# Patient Record
Sex: Female | Born: 1960 | Hispanic: Yes | Marital: Married | Smoking: Never smoker
Health system: Southern US, Community
[De-identification: ages and names within clinical notes are randomized; demographics above are authoritative.]

## PROBLEM LIST (undated history)

## (undated) HISTORY — PX: CHOLECYSTECTOMY: SHX55

---

## 2016-04-18 ENCOUNTER — Encounter: Payer: Self-pay | Admitting: Emergency Medicine

## 2016-04-18 ENCOUNTER — Emergency Department: Payer: BLUE CROSS/BLUE SHIELD

## 2016-04-18 ENCOUNTER — Emergency Department
Admission: EM | Admit: 2016-04-18 | Discharge: 2016-04-18 | Disposition: A | Discharge status: Home / Self Care | Payer: BLUE CROSS/BLUE SHIELD | Attending: Emergency Medicine | Admitting: Emergency Medicine

## 2016-04-18 DIAGNOSIS — R0602 Shortness of breath: Secondary | ICD-10-CM | POA: Insufficient documentation

## 2016-04-18 DIAGNOSIS — R0789 Other chest pain: Secondary | ICD-10-CM | POA: Insufficient documentation

## 2016-04-18 DIAGNOSIS — R079 Chest pain, unspecified: Secondary | ICD-10-CM

## 2016-04-18 DIAGNOSIS — R221 Localized swelling, mass and lump, neck: Secondary | ICD-10-CM

## 2016-04-18 LAB — CBC
HCT: 39.4 % (ref 35.0–47.0)
HEMOGLOBIN: 13.3 g/dL (ref 12.0–16.0)
MCH: 28.9 pg (ref 26.0–34.0)
MCHC: 33.8 g/dL (ref 32.0–36.0)
MCV: 85.6 fL (ref 80.0–100.0)
PLATELETS: 308 10*3/uL (ref 150–440)
RBC: 4.6 MIL/uL (ref 3.80–5.20)
RDW: 13.5 % (ref 11.5–14.5)
WBC: 9.5 10*3/uL (ref 3.6–11.0)

## 2016-04-18 LAB — TSH: TSH: 0.955 u[IU]/mL (ref 0.350–4.500)

## 2016-04-18 LAB — BASIC METABOLIC PANEL
ANION GAP: 6 (ref 5–15)
BUN: 13 mg/dL (ref 6–20)
CO2: 31 mmol/L (ref 22–32)
CREATININE: 0.7 mg/dL (ref 0.44–1.00)
Calcium: 9.8 mg/dL (ref 8.9–10.3)
Chloride: 102 mmol/L (ref 101–111)
GFR calc Af Amer: >60 mL/min (ref >60)
GLUCOSE: 114 mg/dL — AB (ref 65–99)
Potassium: 3.6 mmol/L (ref 3.5–5.1)
Sodium: 139 mmol/L (ref 135–145)

## 2016-04-18 LAB — FIBRIN DERIVATIVES D-DIMER (ARMC ONLY): FIBRIN DERIVATIVES D-DIMER (ARMC): 291.34 (ref 0.00–499.00)

## 2016-04-18 LAB — TROPONIN I: Troponin I: <0.03 ng/mL (ref <0.03)

## 2016-04-18 MED ORDER — ASPIRIN 81 MG PO CHEW
324.0000 mg | CHEWABLE_TABLET | Freq: Once | ORAL | Status: AC
  Administered 2016-04-18: 324 mg via ORAL
  Filled 2016-04-18: qty 4

## 2016-04-18 NOTE — ED Triage Notes (Signed)
Pt presents to ED with c/o intermittent midline chest pain when she is angry. Pt states pain radiates to R side of her neck. Pt also c/o "something jumping in [her] neck". Pt states when she was at dinner she was told by friends noticed that she had "a jumping in her neck". Upon palpation, pt appears to be speaking about her carotid pulse. When patient is sitting with her head in upright position facing forward, pt is noted to have a visible pulsing to either side of her neck. Isaias, Interpreter present for triage.

## 2016-04-18 NOTE — Discharge Instructions (Signed)
You have been seen in the Emergency Department (ED) today for chest pain.  As we have discussed today?s test results are normal, but you may require further testing.  Please follow up with the recommended doctor as instructed above in these documents regarding today?s emergent visit and your recent symptoms to discuss further management.  Continue to take your regular medications. If you are not doing so already, please also take a daily baby aspirin (81 mg), at least until you follow up with your doctor.  Return to the Emergency Department (ED) if you experience any further chest pain/pressure/tightness, difficulty breathing, or sudden sweating, or other symptoms that concern you.   Also, it is suspicious that she was thyroid gland may be enlarged. Your thyroid test today was normal, I recommend follow-up with your regular doctor or Dr. Renae Fickle of endocrinology at your earliest convenience for follow-up and evaluation, possibly an ultrasound of your neck to evaluate your thyroid gland in more detail.

## 2016-04-18 NOTE — ED Provider Notes (Signed)
[facility] Emergency Department Provider Note ____________________________________________   First MD Initiated Contact with Patient 04/18/16 1557     (approximate)  I have reviewed the triage vital signs and the nursing notes.  HISTORY  Chief Complaint Chest Pain and Shortness of Breath  Spanish interpreter, [place] utilized  HPI Leah Miles is a 56 y.o. female reports no significant medical history other than her gallbladder removed several years ago  She comes in today, she reports that on Thursday while at work she was under stress and began experiencing discomfort in the left side of her chest that went away after taking Aleve tablet. It lasts about an hour. She reports she gets this same pain off and on for years whenever she is under "stress". No nausea or vomiting. No radiating pain. She also reports that she's noticed that when she gets stressed the front of her throat seems to "pulsating" their families knows the same and reports been present for many years.  No chest pain now. Last discomfort was on Thursday. Denies any history of heart problems. She does not smoke.  She has never had a stress test. She did not come in until today because she had a work on Thursday, and her family worked the last couple days, today was the first day that she could come for evaluation immediately   History reviewed. No pertinent past medical history.  There are no active problems to display for this patient.   Past Surgical History:  Procedure Laterality Date  . CHOLECYSTECTOMY      Prior to Admission medications   Not on File  Denies any allergies  Allergies Patient has no known allergies.  No family history on file.  Social History Social History  Substance Use Topics  . Smoking status: Never Smoker  . Smokeless tobacco: Never Used  . Alcohol use No    Review of Systems Constitutional: No fever/chills Eyes: No visual changes. ENT: No  sore throat. Cardiovascular: Denies chest pain Now but reports a "discomfort that occurred on Thursday. Respiratory: Denies shortness of breath. However, sometimes during these episodes she does feel slightly breathless, but reports no trouble breathing or cough or any issue for the last 2 days. She did feel a little short of breath during the episode on Thursday. Gastrointestinal: No abdominal pain.  No nausea, no vomiting.  No diarrhea.  No constipation. Genitourinary: Negative for dysuria. Musculoskeletal: Negative for back pain. Skin: Negative for rash. Neurological: Negative for headaches, focal weakness or numbness.  10-point ROS otherwise negative.  ____________________________________________   PHYSICAL EXAM:  VITAL SIGNS: ED Triage Vitals  Enc Vitals Group     BP 04/18/16 1405 (!) 168/87     Pulse Rate 04/18/16 1405 85     Resp 04/18/16 1405 18     Temp 04/18/16 1405 98.7 F (37.1 C)     Temp Source 04/18/16 1405 Oral     SpO2 04/18/16 1405 97 %     Weight 04/18/16 1406 142 lb (64.4 kg)     Height 04/18/16 1406 5' (1.524 m)     Head Circumference --      Peak Flow --      Pain Score --      Pain Loc --      Pain Edu? --      Excl. in GC? --     Constitutional: Alert and oriented. Well appearing and in no acute distress.Very pleasant. See comfortably in the chair. Denying any  discomfort or symptoms now. Eyes: Conjunctivae are normal. PERRL. EOMI. Head: Atraumatic. Nose: No congestion/rhinnorhea. Mouth/Throat: Mucous membranes are moist.  Oropharynx non-erythematous. Neck: No stridor.  Question if she may have a slightly prominent thyroid. There is no tenderness. Cardiovascular: Normal rate, regular rhythm. Grossly normal heart sounds.  Good peripheral circulation. Respiratory: Normal respiratory effort.  No retractions. Lungs CTAB. Gastrointestinal: Soft and nontender. No distention.  Musculoskeletal: No lower extremity tenderness nor edema.   Neurologic:  Normal  speech and language. No gross focal neurologic deficits are appreciated. No gait instability. Skin:  Skin is warm, dry and intact. No rash noted. Psychiatric: Mood and affect are normal. Speech and behavior are normal.  ____________________________________________   LABS (all labs ordered are listed, but only abnormal results are displayed)  Labs Reviewed  BASIC METABOLIC PANEL - Abnormal; Notable for the following:       Result Value   Glucose, Bld 114 (*)    All other components within normal limits  CBC  TROPONIN I  FIBRIN DERIVATIVES D-DIMER ([facility] ONLY)  TROPONIN I  TSH   ____________________________________________  EKG  ED ECG REPORT I, Loella Hickle, the attending physician, personally viewed and interpreted this ECG.  Date: 04/18/2016 EKG Time: 1400 Rate: 90 Rhythm: normal sinus rhythm QRS Axis: normal Intervals: normal ST/T Wave abnormalities: normal Conduction Disturbances: none Narrative Interpretation: unremarkable  ____________________________________________  RADIOLOGY  Dg Chest 2 View  Result Date: 04/18/2016 CLINICAL DATA:  Chest pain for 2 weeks. EXAM: CHEST  2 VIEW COMPARISON:  None. FINDINGS: The heart size and mediastinal contours are within normal limits. Both lungs are clear. The visualized skeletal structures are unremarkable. IMPRESSION: No active cardiopulmonary disease. No evidence of pneumonia or pulmonary edema. Electronically Signed   By: Bary Richard M.D.   On: 04/18/2016 14:56    ____________________________________________   PROCEDURES  Procedure(s) performed: None  Procedures  Critical Care performed: No  ____________________________________________   INITIAL IMPRESSION / ASSESSMENT AND PLAN / ED COURSE  Pertinent labs & imaging results that were available during my care of the patient were reviewed by me and considered in my medical decision making (see chart for details).  Patient presents for evaluation of some  atypical chest discomfort which occurred on Thursday. Also some slight fullness over the thyroid region of the neck without tenderness or discrete mass palpable. No trouble breathing or stridor. No overlying skin changes.  Chest pain. Now resolved. Sounds possibly related to anxiety or stress, and seems to be recurring issue for the patient. Thus far her workup here today is reassuring. No significant risk factors for pulmonary embolism, dissection, pneumothorax. Lax infectious symptoms. No abdominal pain    Patient heart score is low risk. Her troponin is normal as well as her EKG, given her chest pain symptoms were a few days ago and she has not had a recurrence and it very unlikely this represents an acute coronary syndrome. She last significant risk factors other than age. No addition her thyroid seems just slightly prominent in the anterior neck but nontender. I discussed with the patient and recommended they follow-up with cardiology this week with careful chest pain return precautions, and also discussed following up with her primary care doctor for further evaluation of her thyroid including a possible thyroid ultrasound. She and her son are both very agreeable with this plan.  Low risk by well's, negative d-dimer.  ----------------------------------------- 8:06 PM on 04/18/2016 -----------------------------------------  Second troponin normal, no further discomfort or concerns while in the ER.  No chest pain. Discussed with the patient and provided careful discharge plan to follow-up with cardiology as well as endocrinology for follow-up on today's issues. Patient and her son agreeable with plan. Discharge completed via Spanish interpreter. ____________________________________________   FINAL CLINICAL IMPRESSION(S) / ED DIAGNOSES  Final diagnoses:  Chest pain with low risk for cardiac etiology  Neck fullness      NEW MEDICATIONS STARTED DURING THIS VISIT:  New Prescriptions   No  medications on file     Note:  This document was prepared using Dragon voice recognition software and may include unintentional dictation errors.     Sharyn Creamer, MD 04/18/16 2007

## 2016-04-18 NOTE — ED Notes (Signed)

## 2020-04-22 ENCOUNTER — Other Ambulatory Visit: Payer: Self-pay

## 2020-04-22 ENCOUNTER — Emergency Department: Payer: BC Managed Care – PPO

## 2020-04-22 ENCOUNTER — Emergency Department
Admission: EM | Admit: 2020-04-22 | Discharge: 2020-04-22 | Disposition: A | Discharge status: Home / Self Care | Payer: BC Managed Care – PPO | Attending: Emergency Medicine | Admitting: Emergency Medicine

## 2020-04-22 DIAGNOSIS — R109 Unspecified abdominal pain: Secondary | ICD-10-CM

## 2020-04-22 DIAGNOSIS — R030 Elevated blood-pressure reading, without diagnosis of hypertension: Secondary | ICD-10-CM | POA: Diagnosis not present

## 2020-04-22 DIAGNOSIS — N1330 Unspecified hydronephrosis: Secondary | ICD-10-CM | POA: Insufficient documentation

## 2020-04-22 DIAGNOSIS — R1012 Left upper quadrant pain: Secondary | ICD-10-CM | POA: Diagnosis present

## 2020-04-22 LAB — CBC
HCT: 40.5 % (ref 36.0–46.0)
Hemoglobin: 13.7 g/dL (ref 12.0–15.0)
MCH: 28.4 pg (ref 26.0–34.0)
MCHC: 33.8 g/dL (ref 30.0–36.0)
MCV: 84 fL (ref 80.0–100.0)
Platelets: 290 10*3/uL (ref 150–400)
RBC: 4.82 MIL/uL (ref 3.87–5.11)
RDW: 14 % (ref 11.5–15.5)
WBC: 11.9 10*3/uL — ABNORMAL HIGH (ref 4.0–10.5)
nRBC: 0 % (ref 0.0–0.2)

## 2020-04-22 LAB — HEPATIC FUNCTION PANEL
ALT: 22 U/L (ref 0–44)
AST: 34 U/L (ref 15–41)
Albumin: 4.7 g/dL (ref 3.5–5.0)
Alkaline Phosphatase: 97 U/L (ref 38–126)
Bilirubin, Direct: <0.1 mg/dL (ref 0.0–0.2)
Total Bilirubin: 0.6 mg/dL (ref 0.3–1.2)
Total Protein: 7.5 g/dL (ref 6.5–8.1)

## 2020-04-22 LAB — BASIC METABOLIC PANEL
Anion gap: 12 (ref 5–15)
BUN: 12 mg/dL (ref 6–20)
CO2: 26 mmol/L (ref 22–32)
Calcium: 9.4 mg/dL (ref 8.9–10.3)
Chloride: 100 mmol/L (ref 98–111)
Creatinine, Ser: 0.85 mg/dL (ref 0.44–1.00)
GFR, Estimated: >60 mL/min (ref >60)
Glucose, Bld: 133 mg/dL — ABNORMAL HIGH (ref 70–99)
Potassium: 3.5 mmol/L (ref 3.5–5.1)
Sodium: 138 mmol/L (ref 135–145)

## 2020-04-22 LAB — URINALYSIS, COMPLETE (UACMP) WITH MICROSCOPIC
Bacteria, UA: NONE SEEN
Bilirubin Urine: NEGATIVE
Glucose, UA: NEGATIVE mg/dL
Ketones, ur: NEGATIVE mg/dL
Leukocytes,Ua: NEGATIVE
Nitrite: NEGATIVE
Protein, ur: NEGATIVE mg/dL
RBC / HPF: >50 RBC/hpf — ABNORMAL HIGH (ref 0–5)
Specific Gravity, Urine: 1.01 (ref 1.005–1.030)
pH: 6 (ref 5.0–8.0)

## 2020-04-22 LAB — LIPASE, BLOOD: Lipase: 33 U/L (ref 11–51)

## 2020-04-22 MED ORDER — ONDANSETRON HCL 4 MG/2ML IJ SOLN
4.0000 mg | Freq: Once | INTRAMUSCULAR | Status: AC
  Administered 2020-04-22: 4 mg via INTRAVENOUS
  Filled 2020-04-22: qty 2

## 2020-04-22 MED ORDER — IOHEXOL 300 MG/ML  SOLN
100.0000 mL | Freq: Once | INTRAMUSCULAR | Status: AC | PRN
  Administered 2020-04-22: 100 mL via INTRAVENOUS
  Filled 2020-04-22: qty 100

## 2020-04-22 MED ORDER — MORPHINE SULFATE (PF) 4 MG/ML IV SOLN
4.0000 mg | Freq: Once | INTRAVENOUS | Status: AC
  Administered 2020-04-22: 4 mg via INTRAVENOUS
  Filled 2020-04-22: qty 1

## 2020-04-22 MED ORDER — OXYCODONE-ACETAMINOPHEN 5-325 MG PO TABS: 1.0000 | ORAL_TABLET | ORAL | 0 refills | Status: DC | PRN

## 2020-04-22 MED ORDER — ONDANSETRON 4 MG PO TBDP: 4.0000 mg | ORAL_TABLET | Freq: Three times a day (TID) | ORAL | 0 refills | Status: DC | PRN

## 2020-04-22 NOTE — ED Provider Notes (Signed)
[facility] Emergency Department Provider Note   ____________________________________________   Event Date/Time   First MD Initiated Contact with Patient 04/22/20 1414     (approximate)  I have reviewed the triage vital signs and the nursing notes.   HISTORY  Chief Complaint Flank Pain and Vomiting    HPI Leah Miles is a 60 y.o. female with no significant past medical history who presents to the ED complaining of flank pain.  Patient reports that she developed severe pain in her left flank this morning around 5:30 AM.  Pain has started to radiate around to the left upper quadrant of her abdomen, is described as sharp and constant.  Pain is not exacerbated or alleviated by anything, but is associated with multiple episodes of vomiting.  She denies any diarrhea, fever, dysuria, or hematuria.  She reports dealing with similar symptoms multiple years ago with no clear diagnosis at that time.  She denies any history of kidney stones.  She is postmenopausal.        History reviewed. No pertinent past medical history.  There are no problems to display for this patient.   Past Surgical History:  Procedure Laterality Date  . CHOLECYSTECTOMY      Prior to Admission medications   Medication Sig Start Date End Date Taking? Authorizing Provider  ondansetron (ZOFRAN ODT) 4 MG disintegrating tablet Take 1 tablet (4 mg total) by mouth every 8 (eight) hours as needed for nausea or vomiting. 04/22/20  Yes Chesley Noon, MD  oxyCODONE-acetaminophen (PERCOCET) 5-325 MG tablet Take 1 tablet by mouth every 4 (four) hours as needed for severe pain. 04/22/20 04/22/21 Yes Chesley Noon, MD    Allergies Patient has no known allergies.  History reviewed. No pertinent family history.  Social History Social History   Tobacco Use  . Smoking status: Never Smoker  . Smokeless tobacco: Never Used  Substance Use Topics  . Alcohol use: No  . Drug use: No     Review of Systems  Constitutional: No fever/chills Eyes: No visual changes. ENT: No sore throat. Cardiovascular: Denies chest pain. Respiratory: Denies shortness of breath. Gastrointestinal: Positive for flank and abdominal pain.  Positive for nausea and vomiting.  No diarrhea.  No constipation. Genitourinary: Negative for dysuria. Musculoskeletal: Negative for back pain. Skin: Negative for rash. Neurological: Negative for headaches, focal weakness or numbness.  ____________________________________________   PHYSICAL EXAM:  VITAL SIGNS: ED Triage Vitals  Enc Vitals Group     BP 04/22/20 1409 (!) 203/113     Pulse Rate 04/22/20 1409 (!) 107     Resp 04/22/20 1409 16     Temp 04/22/20 1409 98.4 F (36.9 C)     Temp Source 04/22/20 1409 Oral     SpO2 04/22/20 1409 99 %     Weight 04/22/20 1410 160 lb (72.6 kg)     Height 04/22/20 1410 5\' 2"  (1.575 m)     Head Circumference --      Peak Flow --      Pain Score 04/22/20 1409 8     Pain Loc --      Pain Edu? --      Excl. in GC? --     Constitutional: Alert and oriented. Eyes: Conjunctivae are normal. Head: Atraumatic. Nose: No congestion/rhinnorhea. Mouth/Throat: Mucous membranes are moist. Neck: Normal ROM Cardiovascular: Normal rate, regular rhythm. Grossly normal heart sounds. Respiratory: Normal respiratory effort.  No retractions. Lungs CTAB. Gastrointestinal: Soft and tender to palpation in the  left upper quadrant with no rebound or guarding.  No CVA tenderness noted. No distention. Genitourinary: deferred Musculoskeletal: No lower extremity tenderness nor edema. Neurologic:  Normal speech and language. No gross focal neurologic deficits are appreciated. Skin:  Skin is warm, dry and intact. No rash noted. Psychiatric: Mood and affect are normal. Speech and behavior are normal.  ____________________________________________   LABS (all labs ordered are listed, but only abnormal results are  displayed)  Labs Reviewed  URINALYSIS, COMPLETE (UACMP) WITH MICROSCOPIC - Abnormal; Notable for the following components:      Result Value   Color, Urine YELLOW (*)    APPearance HAZY (*)    Hgb urine dipstick LARGE (*)    RBC / HPF >50 (*)    All other components within normal limits  BASIC METABOLIC PANEL - Abnormal; Notable for the following components:   Glucose, Bld 133 (*)    All other components within normal limits  CBC - Abnormal; Notable for the following components:   WBC 11.9 (*)    All other components within normal limits  HEPATIC FUNCTION PANEL  LIPASE, BLOOD   ____________________________________________  EKG  ED ECG REPORT I, Chesley Noon, the attending physician, personally viewed and interpreted this ECG.   Date: 04/22/2020  EKG Time: 14:54  Rate: 87  Rhythm: normal sinus rhythm  Axis: Normal  Intervals:none  ST&T Change: None   PROCEDURES  Procedure(s) performed (including Critical Care):  Procedures   ____________________________________________   INITIAL IMPRESSION / ASSESSMENT AND PLAN / ED COURSE       60 year old female with no significant past medical history presents to the ED complaining of acute onset left flank pain radiating around to the left upper quadrant of her abdomen starting at 530 this morning.  Patient has some tenderness in her left upper quadrant but no CVA tenderness and denies any urinary symptoms.  UA is pending to further assess for pyelonephritis, we will also check CT scan to rule out nephrolithiasis, renal ischemia, or perforated gastric ulcer.  Patient noted to be markedly hypertensive, denies history of hypertension.  Low suspicion for acute aortic emergency given description of her symptoms, EKG is unremarkable and I doubt other hypertensive emergency.  We will treat pain and recheck blood pressure.  Blood pressure significantly improved following pain medication.  UA shows hematuria but no signs of infection,  lab work also reassuring.  CT scan shows ureterectasis and hydronephrosis on the left but with no obvious source of obstruction.  It is possible patient passed kidney stone or has stone not visible due to IV contrast.  Imaging discussed with Dr. Lonna Cobb of urology, who agrees that patient is appropriate for outpatient follow-up with symptomatic treatment for now.  She has a mildly enlarged bladder but has had no difficulty urinating since the time of her CT scan.  She was counseled to return to the ED for new worsening symptoms, patient agrees with plan.      ____________________________________________   FINAL CLINICAL IMPRESSION(S) / ED DIAGNOSES  Final diagnoses:  Flank pain  Hydronephrosis, unspecified hydronephrosis type     ED Discharge Orders         Ordered    oxyCODONE-acetaminophen (PERCOCET) 5-325 MG tablet  Every 4 hours PRN        04/22/20 1627    ondansetron (ZOFRAN ODT) 4 MG disintegrating tablet  Every 8 hours PRN        04/22/20 1627  Note:  This document was prepared using Dragon voice recognition software and may include unintentional dictation errors.   Chesley Noon, MD 04/22/20 [phone]

## 2020-04-22 NOTE — ED Notes (Signed)
Pain med given.  Says not much better yet.  She does appear more relaxed and is able to lie down on the bed.

## 2020-04-22 NOTE — ED Triage Notes (Signed)
C/o L sided flank pain with vomiting that began today. Took 1 ibuprofen and aleeve. A&O, ambulatory. Spanish interpreter used for triage.

## 2020-04-24 ENCOUNTER — Ambulatory Visit: Payer: BC Managed Care – PPO | Admitting: Urology

## 2020-04-24 ENCOUNTER — Other Ambulatory Visit: Payer: Self-pay

## 2020-04-24 ENCOUNTER — Encounter: Payer: Self-pay | Admitting: Urology

## 2020-04-24 VITALS — BP 179/102 | HR 92 | Ht 60.0 in | Wt 168.0 lb

## 2020-04-24 DIAGNOSIS — R3129 Other microscopic hematuria: Secondary | ICD-10-CM

## 2020-04-24 DIAGNOSIS — N289 Disorder of kidney and ureter, unspecified: Secondary | ICD-10-CM

## 2020-04-24 DIAGNOSIS — N1330 Unspecified hydronephrosis: Secondary | ICD-10-CM

## 2020-04-25 ENCOUNTER — Encounter: Payer: Self-pay | Admitting: Urology

## 2020-04-25 NOTE — Progress Notes (Signed)
   04/24/2020 11:54 AM   [name] Nov 25, 1960 [id number]  Referring provider: No referring provider defined for this encounter.  Chief Complaint  Patient presents with  . Other    HPI: Leah Miles is a 60 y.o. female who presents in follow-up of a recent [facility] ED visit.  She is accompanied today by her husband and a [employer] Spanish interpreter.   Presented to ED 04/22/2020 with a 1 day history of left flank pain which radiated to the left upper quadrant  Pain rated severe without precipitating, aggravating or alleviating factors  + Nausea/vomiting  - Fever, chills  No dysuria or gross hematuria  No prior urologic history or similar symptoms  CT abdomen/pelvis with contrast was performed which showed mild left ureterectasis and perinephric stranding  Urinalysis showed >50 RBCs  She received parenteral analgesics with resolution of her pain and states her pain never recurred and she is presently asymptomatic.   PMH: History reviewed. No pertinent past medical history.  Surgical History: Past Surgical History:  Procedure Laterality Date  . CHOLECYSTECTOMY      Home Medications:  Allergies as of 04/24/2020   No Known Allergies     Medication List       Accurate as of April 24, 2020 11:59 PM. If you have any questions, ask your nurse or doctor.        STOP taking these medications   ondansetron 4 MG disintegrating tablet Commonly known as: Zofran ODT Stopped by: Riki Altes, MD   oxyCODONE-acetaminophen 5-325 MG tablet Commonly known as: Percocet Stopped by: Riki Altes, MD       Allergies: No Known Allergies  Family History: History reviewed. No pertinent family history.  Social History:  reports that she has never smoked. She has never used smokeless tobacco. She reports that she does not drink alcohol and does not use drugs.   Physical Exam: BP (!) 179/102   Pulse 92   Ht 5' (1.524 m)   Wt 168 lb (76.2 kg)   BMI 32.81  kg/m   Constitutional:  Alert, No acute distress. HEENT: [place] AT, moist mucus membranes.  Trachea midline, no masses. Cardiovascular: No clubbing, cyanosis, or edema. Respiratory: Normal respiratory effort, no increased work of breathing.   Laboratory Data:  Urinalysis Dipstick/microscopy negative  Pertinent Imaging: CT images were personally reviewed and interpreted  Assessment & Plan:    60 y.o. female with a history of left flank pain and microhematuria.  CT showed mild left hydronephrosis/hydroureter.  She most likely had a small stone which she has passed.  Pain and microhematuria have resolved.  Recommend a follow-up renal ultrasound in ~ 1 month to document resolution of hydronephrosis.  Repeat UA at that time.   Riki Altes, MD  [facility] [address] [place], [place] [address] [phone]

## 2020-04-28 LAB — MICROSCOPIC EXAMINATION: RBC: NONE SEEN /hpf (ref 0–2)

## 2020-04-28 LAB — URINALYSIS, COMPLETE
Bilirubin, UA: NEGATIVE
Glucose, UA: NEGATIVE
Ketones, UA: NEGATIVE
Leukocytes,UA: NEGATIVE
Nitrite, UA: NEGATIVE
Protein,UA: NEGATIVE
RBC, UA: NEGATIVE
Specific Gravity, UA: 1.015 (ref 1.005–1.030)
Urobilinogen, Ur: 0.2 mg/dL (ref 0.2–1.0)
pH, UA: 6 (ref 5.0–7.5)

## 2020-05-23 ENCOUNTER — Ambulatory Visit
Admission: RE | Admit: 2020-05-23 | Discharge: 2020-05-23 | Disposition: A | Discharge status: Home / Self Care | Payer: BC Managed Care – PPO | Source: Ambulatory Visit | Attending: Urology | Admitting: Urology

## 2020-05-23 ENCOUNTER — Other Ambulatory Visit: Payer: Self-pay

## 2020-05-23 DIAGNOSIS — N1330 Unspecified hydronephrosis: Secondary | ICD-10-CM | POA: Diagnosis present

## 2020-05-29 ENCOUNTER — Encounter: Payer: Self-pay | Admitting: Urology

## 2020-05-29 ENCOUNTER — Other Ambulatory Visit: Payer: Self-pay

## 2020-05-29 ENCOUNTER — Ambulatory Visit: Payer: BC Managed Care – PPO | Admitting: Urology

## 2020-05-29 DIAGNOSIS — Z87448 Personal history of other diseases of urinary system: Secondary | ICD-10-CM

## 2020-05-29 NOTE — Progress Notes (Signed)
   05/29/2020 1:41 PM   Leah Miles [birth year] [id number]  Referring provider: No referring provider defined for this encounter.  Chief Complaint  Patient presents with  . Other    HPI: 60 y.o. female presents for follow-up.  A Spanish interpreter was present via video link.   Refer to my previous note 04/24/2020  She remains asymptomatic without recurrent pain  Follow-up renal ultrasound 05/23/2020 showed resolution of her hydronephrosis  She states she does note back pain after eating mangoes or bananas   PMH: History reviewed. No pertinent past medical history.  Surgical History: Past Surgical History:  Procedure Laterality Date  . CHOLECYSTECTOMY      Home Medications:  Allergies as of 05/29/2020   No Known Allergies     Medication List       Accurate as of May 29, 2020  1:41 PM. If you have any questions, ask your nurse or doctor.        metaxalone 800 MG tablet Commonly known as: SKELAXIN Take 1 tablet by mouth 3 (three) times daily.       Allergies: No Known Allergies  Family History: History reviewed. No pertinent family history.  Social History:  reports that she has never smoked. She has never used smokeless tobacco. She reports that she does not drink alcohol and does not use drugs.   Physical Exam: There were no vitals taken for this visit.  Constitutional:  Alert and oriented, No acute distress. HEENT: [place] AT, moist mucus membranes.  Trachea midline, no masses. Cardiovascular: No clubbing, cyanosis, or edema. Respiratory: Normal respiratory effort, no increased work of breathing. GI: Abdomen is soft, nontender, nondistended, no abdominal masses GU: No CVA tenderness Lymph: No cervical or inguinal lymphadenopathy. Skin: No rashes, bruises or suspicious lesions. Neurologic: Grossly intact, no focal deficits, moving all 4 extremities. Psychiatric: Normal mood and affect.  Laboratory Data:  Urinalysis Dipstick/microscopy  negative  Pertinent Imaging: Images were personally reviewed  Ultrasound renal complete  Narrative CLINICAL DATA:  Follow-up hydronephrosis of left kidney.  EXAM: RENAL / URINARY TRACT ULTRASOUND COMPLETE  COMPARISON:  CT abdomen pelvis 04/22/2020  FINDINGS: Right Kidney:  Renal measurements: 10.9 x 3.8 x 4.4 cm = volume: 96 mL. Echogenicity within normal limits. No mass or hydronephrosis visualized.  Left Kidney:  Renal measurements: 10.1 x 5.1 x 4.9 cm = volume: 132 mL. Echogenicity within normal limits. No mass or hydronephrosis visualized.  Bladder:  Appears normal for degree of bladder distention.  Other:  Incidentally noted the liver echogenicity appears diffusely increased as can be seen with hepatic steatosis.  IMPRESSION: 1. No bilateral hydronephrosis.  2. Diffusely increased liver parenchymal echogenicity which is nonspecific but most commonly seen with hepatic steatosis.   Electronically Signed By: Emmaline Kluver M.D. On: 05/24/2020 13:20  No results found for this or any previous visit.  No results found for this or any previous visit.  No results found for this or any previous visit.   Assessment & Plan:    1. Hydronephrosis of left kidney  Resolved  Symptoms most likely secondary to a small stone which passed  Recommend 1 year follow-up with [facility]  2.  History microhematuria  Last 2 UAs have been negative and feel microhematuria in ED was secondary to stone   Riki Altes, MD  [facility] [address] [place], [place] [address] [phone]

## 2020-05-30 LAB — URINALYSIS, COMPLETE
Bilirubin, UA: NEGATIVE
Glucose, UA: NEGATIVE
Ketones, UA: NEGATIVE
Leukocytes,UA: NEGATIVE
Nitrite, UA: NEGATIVE
Protein,UA: NEGATIVE
RBC, UA: NEGATIVE
Specific Gravity, UA: <1.005 — ABNORMAL LOW (ref 1.005–1.030)
Urobilinogen, Ur: 0.2 mg/dL (ref 0.2–1.0)
pH, UA: 6 (ref 5.0–7.5)

## 2020-05-30 LAB — MICROSCOPIC EXAMINATION
Bacteria, UA: NONE SEEN
RBC, Urine: NONE SEEN /hpf (ref 0–2)
WBC, UA: NONE SEEN /hpf (ref 0–5)

## 2021-06-01 ENCOUNTER — Other Ambulatory Visit: Payer: Self-pay | Admitting: *Deleted

## 2021-06-01 DIAGNOSIS — N1330 Unspecified hydronephrosis: Secondary | ICD-10-CM

## 2021-06-04 ENCOUNTER — Ambulatory Visit: Payer: BC Managed Care – PPO | Admitting: Urology

## 2021-06-07 IMAGING — CT CT ABD-PELV W/ CM
2 of 5 series · 14 of 46 positions shown, 16 images · IV contrast (APPLIED)
Comparison: None.

CLINICAL DATA: Left upper quadrant abdominal pain and vomiting.

EXAM:
CT ABDOMEN AND PELVIS WITH CONTRAST
TECHNIQUE: Multidetector CT imaging of the abdomen and pelvis was performed
using the standard protocol following bolus administration of
intravenous contrast.
CONTRAST:  100mL OMNIPAQUE IOHEXOL 300 MG/ML  SOLN

[Series 2: axial st · axial · 0.73mm/px · z∈[-496,-71]mm · 11 of 97 slices shown, 13 images]
[im 6/97  soft-tissue]
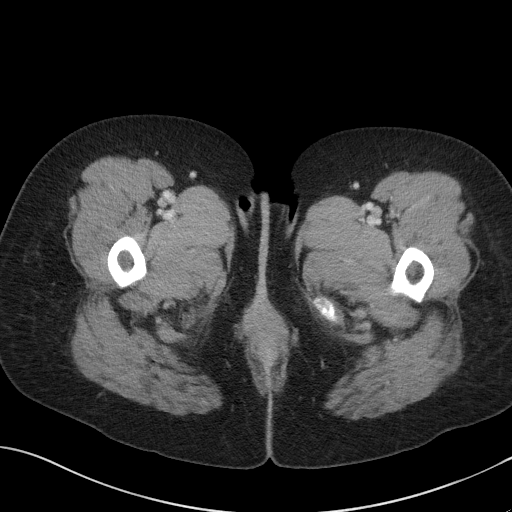
[im 6/97  bone]
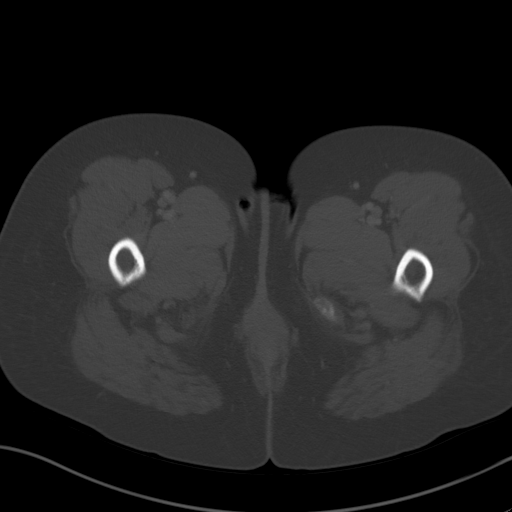
[im 17/97  soft-tissue]
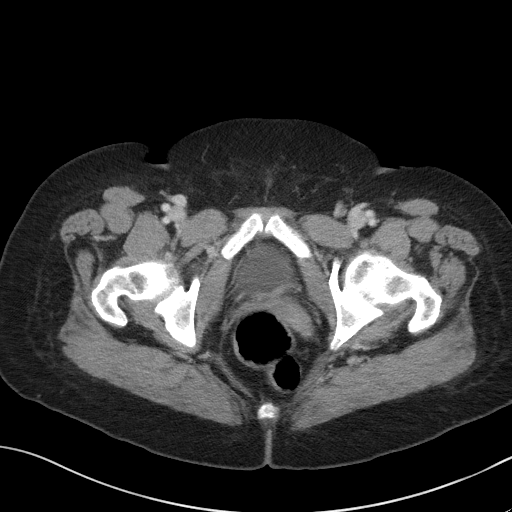
[im 22/97  soft-tissue]
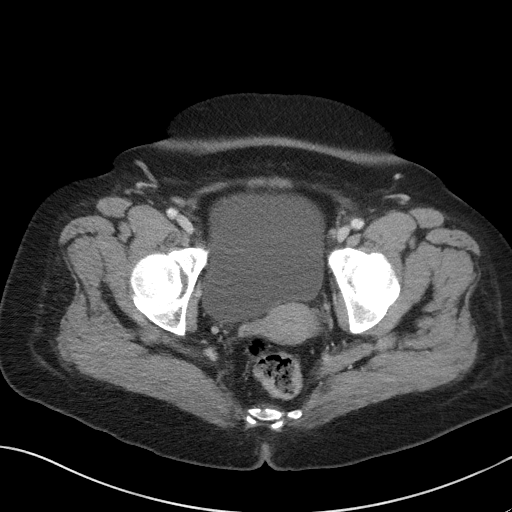
[im 33/97  soft-tissue]
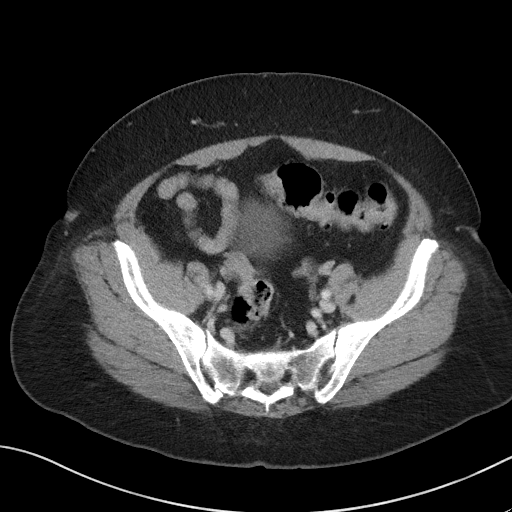
[im 38/97  soft-tissue]
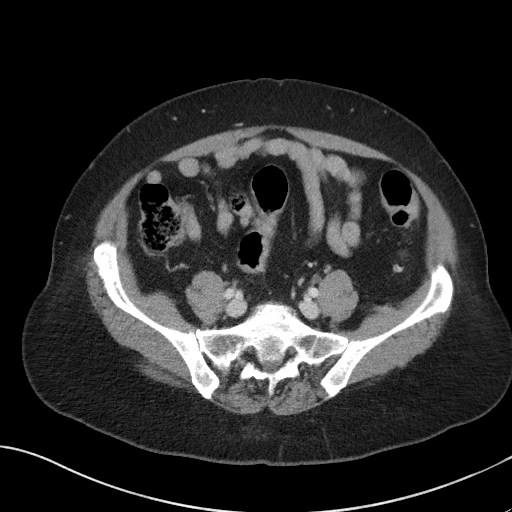
[im 49/97  soft-tissue]
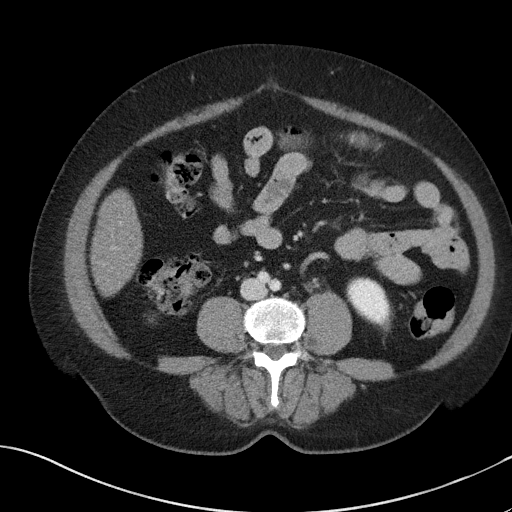
[im 59/97  soft-tissue]
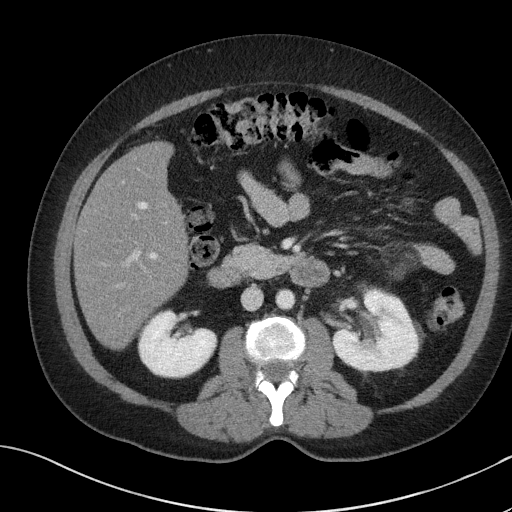
[im 65/97  soft-tissue]
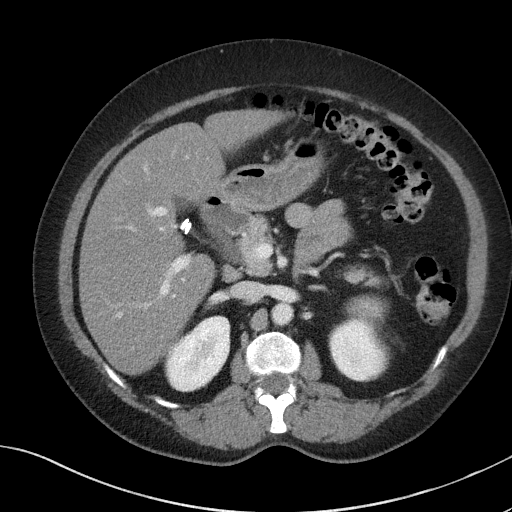
[im 75/97  soft-tissue]
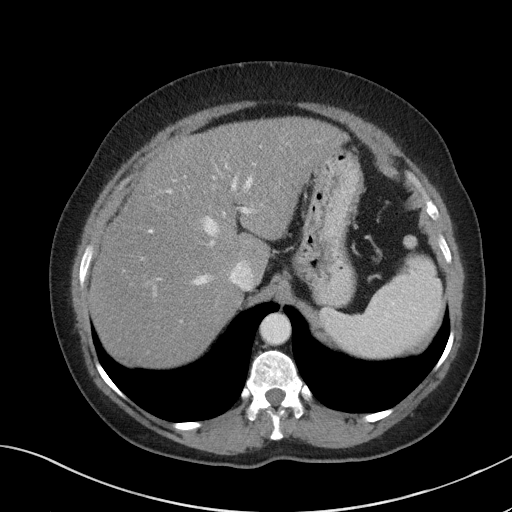
[im 75/97  bone]
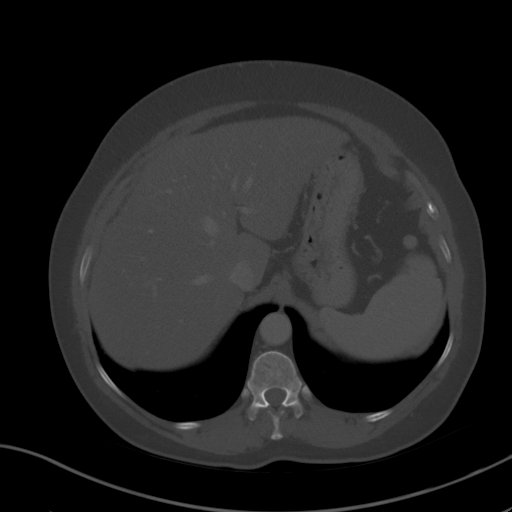
[im 81/97  soft-tissue]
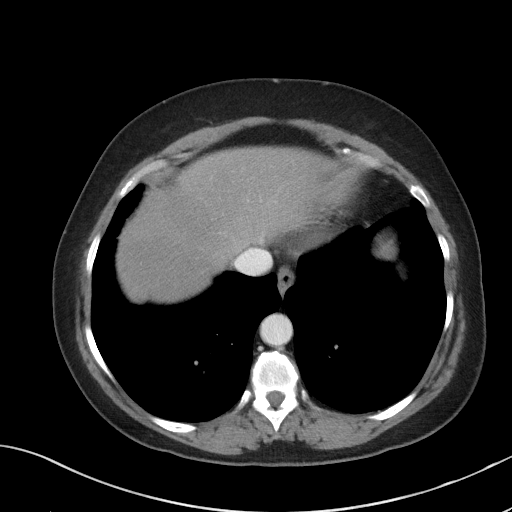
[im 91/97  soft-tissue]
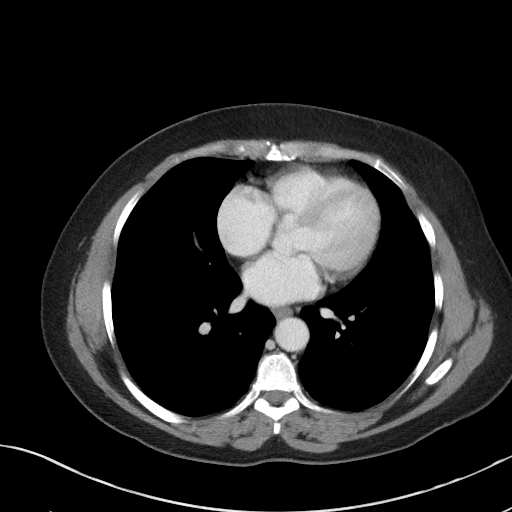

[Series 5: coronal st · coronal · 0.76mm/px · 3 of 102 slices shown]
[im 34/102  soft-tissue]
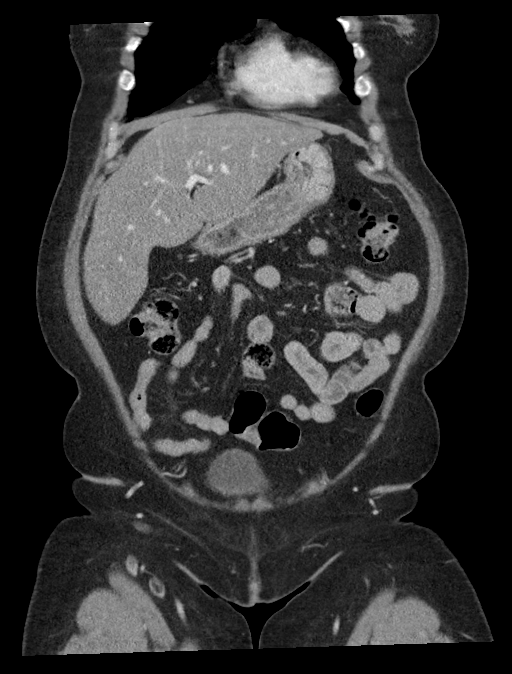
[im 45/102  soft-tissue]
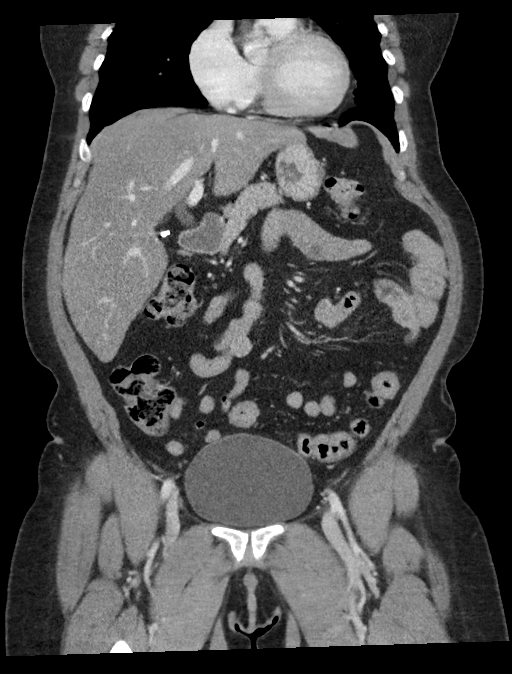
[im 57/102  soft-tissue]
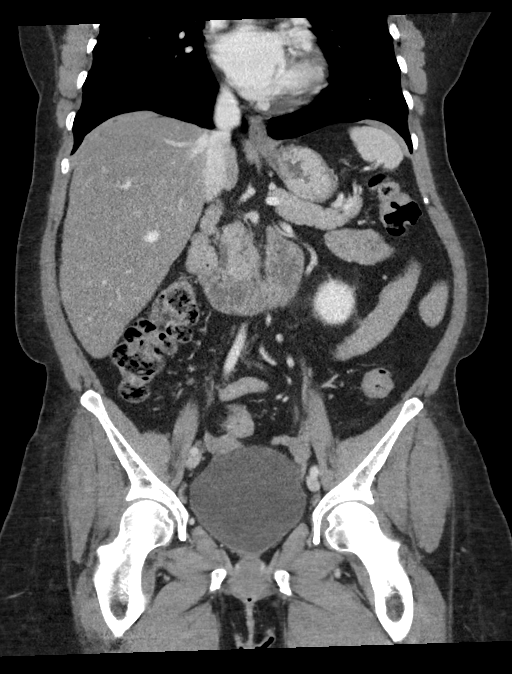

[14 of 46 positions shown; findings below may reference images not displayed]

FINDINGS: Lower chest: Limited visualization of the lower thorax demonstrates
minimal subpleural ground-glass atelectasis within the subpleural
aspect of the right lower lobe as well as the right costophrenic
angle. No discrete focal airspace opacities. No pleural effusion.

Normal heart size.  No pericardial effusion.

Hepatobiliary: Normal hepatic contour. There is diffuse decreased
attenuation hepatic parenchyma on this postcontrast examination
suggestive of hepatic steatosis. No discrete hepatic lesions. Post
cholecystectomy. No intra or extrahepatic biliary dilatation. No
ascites.

Pancreas: Normal appearance of the pancreas.

Spleen: Normal appearance of the spleen. Two punctate splenules are
noted about the anterior tip of the spleen. No evidence of
splenomegaly.

Adrenals/Urinary Tract: There is symmetric enhancement and excretion
of the bilateral kidneys. Mild left-sided ureterectasis and
asymmetric left-sided perinephric stranding, the etiology of which
is not depicted on this examination. Specifically, no evidence of
nephrolithiasis on this postcontrast examination. No evidence of
right-sided urinary obstruction. Solitary punctate subcentimeter
left-sided renal lesion is too small to accurately characterize
(coronal image 76, series 5), though favored to represent a renal
cyst.

Normal appearance of the bilateral adrenal glands.

Normal appearance of the urinary bladder given degree distention.

Stomach/Bowel: Normal appearance of the terminal ileum and appendix.
Moderate to large colonic stool burden without evidence of enteric
obstruction. No discrete areas of bowel wall thickening. No hiatal
hernia. No pneumoperitoneum, pneumatosis or portal venous gas.

Vascular/Lymphatic: Normal caliber of the abdominal aorta. The major
branch vessels of the abdominal aorta appear patent on this non CTA
examination.

No bulky retroperitoneal, mesenteric, pelvic or inguinal
lymphadenopathy.

Reproductive: Normal appearance of the pelvic organs. No discrete
adnexal lesion. No free fluid within the pelvic cul-de-sac.

Other: Tiny mesenteric fat containing periumbilical hernia. There is
a minimal amount of subcutaneous edema about the midline of the low
back.

Musculoskeletal: No acute or aggressive osseous abnormalities.
Stigmata of dish within the thoracic spine. Scattered bone islands
within the ilium bilaterally (image 62 and 63, series 2).
IMPRESSION: 1. Mild left-sided ureterectasis and asymmetric left-sided
perinephric stranding, the etiology of which is not depicted on this
examination. Specifically, no evidence of nephrolithiasis.
Correlation with urinalysis is advised.
2. Otherwise, no explanation for patient's left upper quadrant
abdominal pain. Specifically, no evidence of enteric or urinary
obstruction. Normal appearance of the appendix.
3. Suspected hepatic steatosis. Correlation with LFTs is advised.
4. Post cholecystectomy.

## 2021-06-19 ENCOUNTER — Ambulatory Visit: Payer: BC Managed Care – PPO | Admitting: Urology

## 2021-07-02 ENCOUNTER — Ambulatory Visit: Payer: BC Managed Care – PPO | Admitting: Urology

## 2021-07-03 ENCOUNTER — Encounter: Payer: Self-pay | Admitting: Urology

## 2021-07-08 IMAGING — US US RENAL
1 series · 14 of 25 positions shown · non-contrast
Comparison: CT abdomen pelvis 04/22/2020

CLINICAL DATA: Follow-up hydronephrosis of left kidney.

EXAM:
RENAL / URINARY TRACT ULTRASOUND COMPLETE

[Series 1: us renal · 14 of 39 slices shown]
[im 1/39]
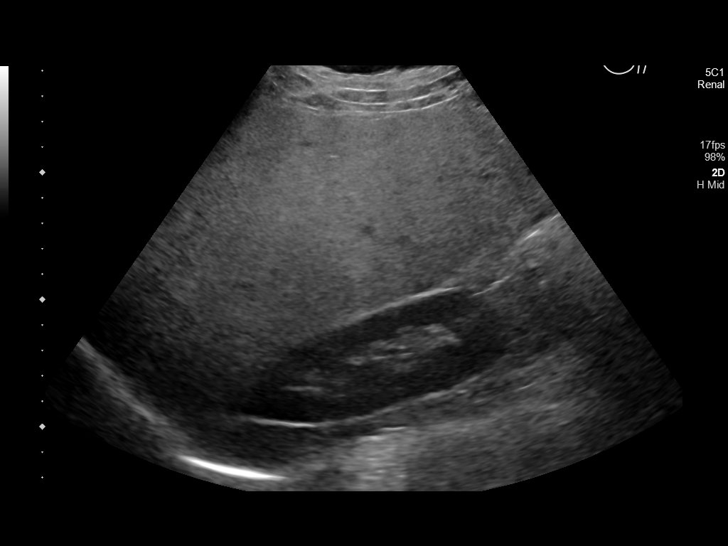
[im 4/39]
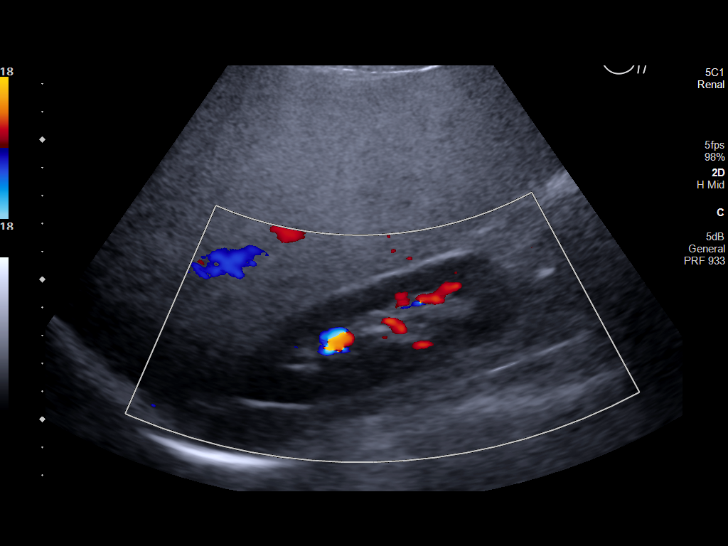
[im 7/39]
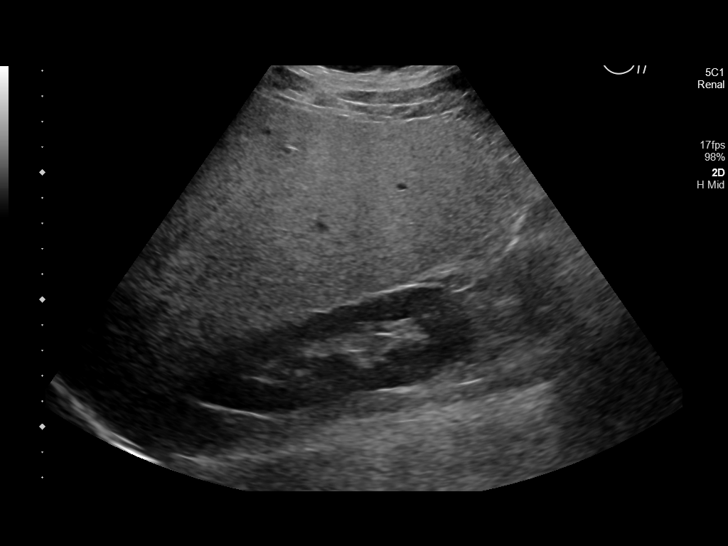
[im 10/39]
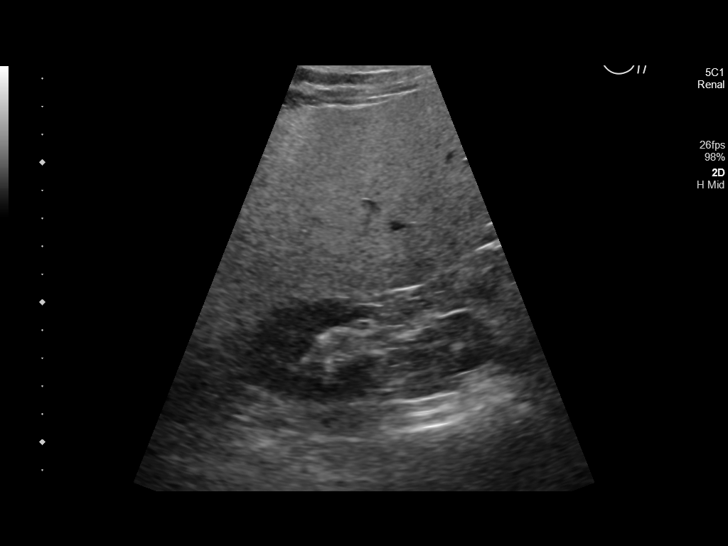
[im 13/39]
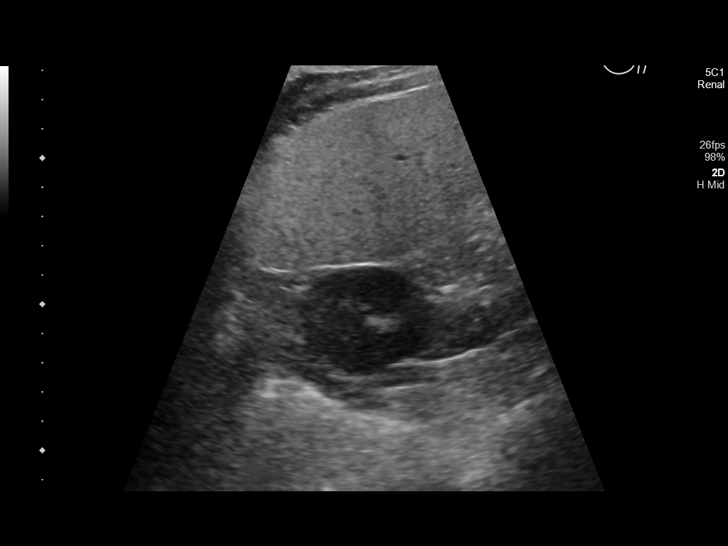
[im 15/39]
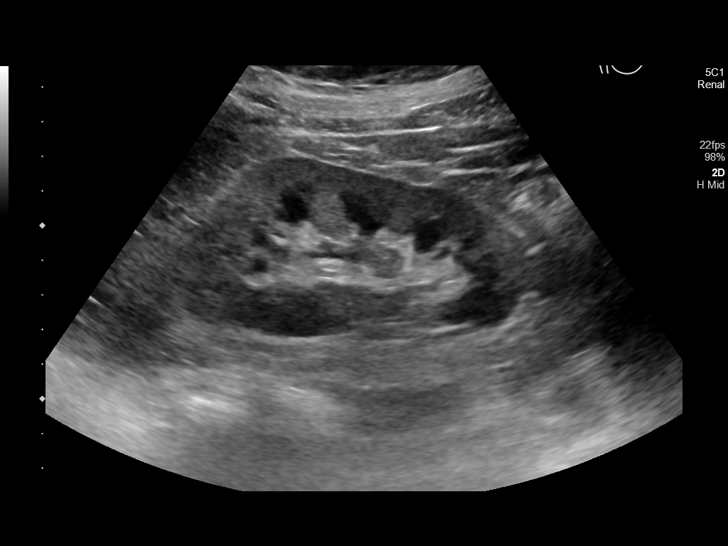
[im 18/39]
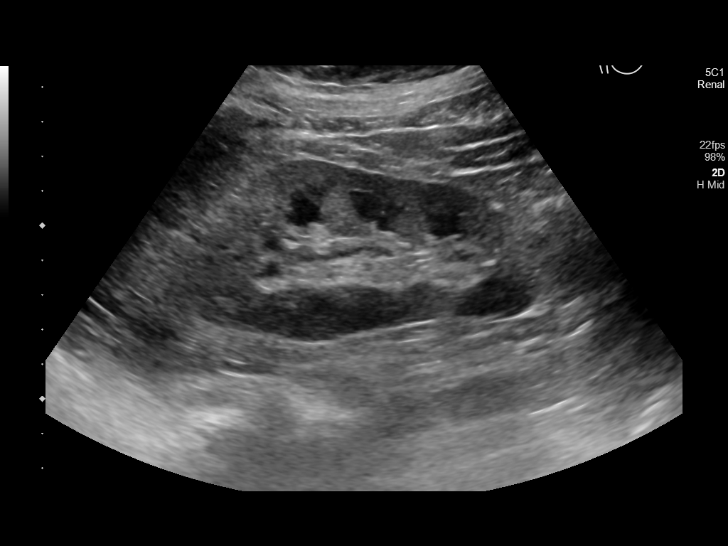
[im 21/39]
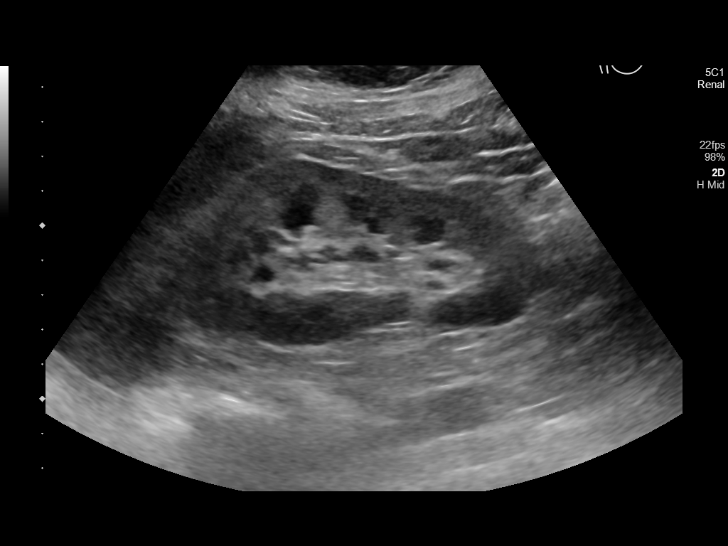
[im 24/39]
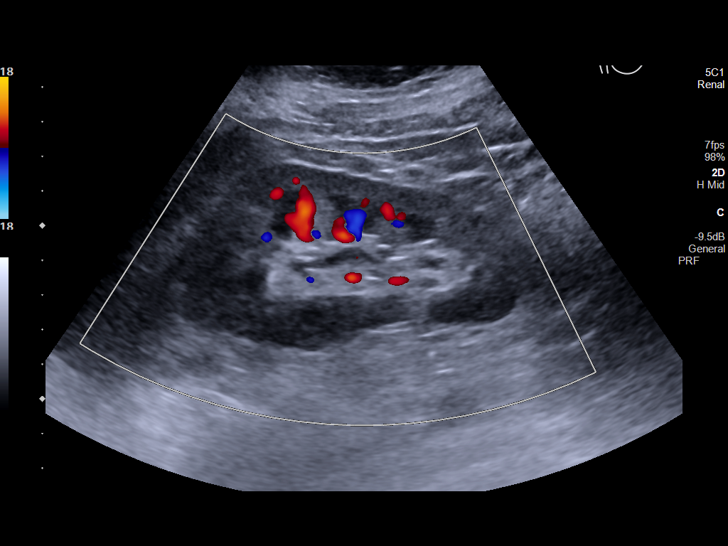
[im 26/39]
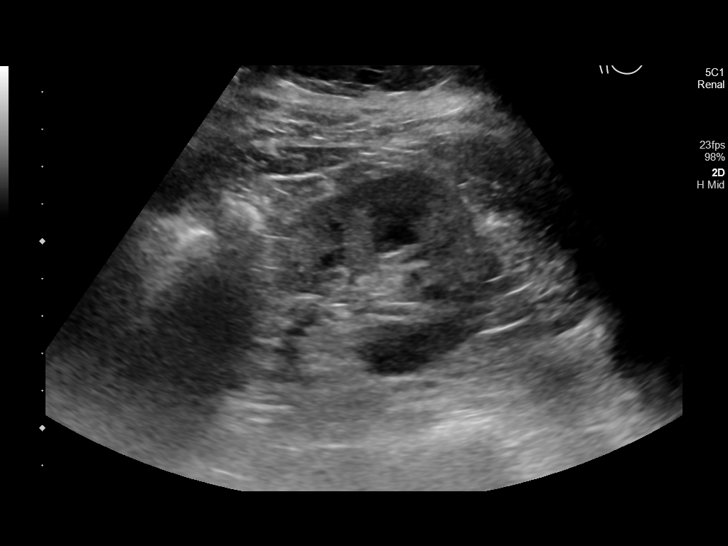
[im 29/39]
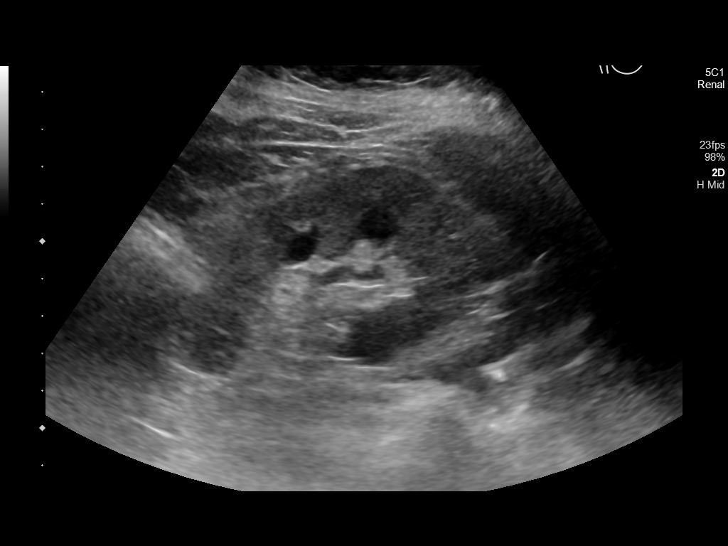
[im 32/39]
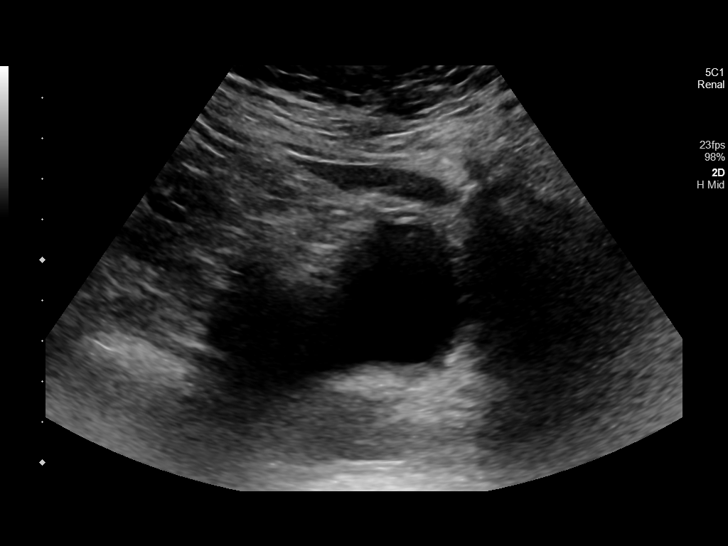
[im 35/39]
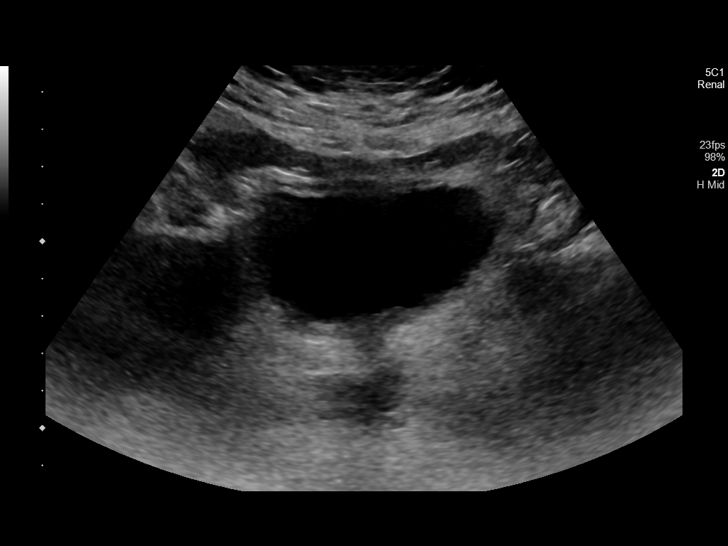
[im 39/39]
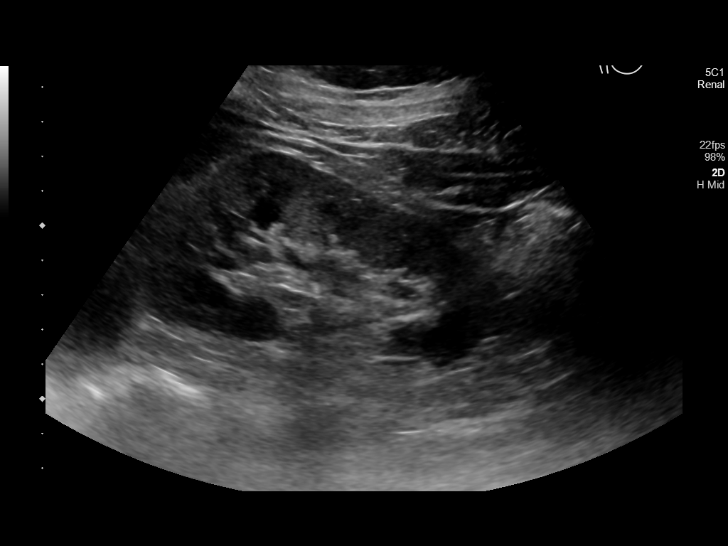

[14 of 25 positions shown; findings below may reference images not displayed]

FINDINGS: Right Kidney:

Renal measurements: 10.9 x 3.8 x 4.4 cm = volume: 96 mL.
Echogenicity within normal limits. No mass or hydronephrosis
visualized.

Left Kidney:

Renal measurements: 10.1 x 5.1 x 4.9 cm = volume: 132 mL.
Echogenicity within normal limits. No mass or hydronephrosis
visualized.

Bladder:

Appears normal for degree of bladder distention.

Other:

Incidentally noted the liver echogenicity appears diffusely
increased as can be seen with hepatic steatosis.
IMPRESSION: 1. No bilateral hydronephrosis.

2. Diffusely increased liver parenchymal echogenicity which is
nonspecific but most commonly seen with hepatic steatosis.
